# Patient Record
Sex: Male | Born: 1995 | Hispanic: No | Marital: Single | Smoking: Never smoker
Health system: Southern US, Community
[De-identification: ages and names within clinical notes are randomized; demographics above are authoritative.]

---
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
## 2014-04-05 ENCOUNTER — Emergency Department: Payer: Self-pay | Admitting: Emergency Medicine

## 2014-11-25 ENCOUNTER — Emergency Department: Payer: Self-pay | Admitting: Emergency Medicine

## 2015-12-11 ENCOUNTER — Encounter: Payer: Self-pay | Admitting: Emergency Medicine

## 2015-12-11 ENCOUNTER — Emergency Department
Admission: EM | Admit: 2015-12-11 | Discharge: 2015-12-11 | Disposition: A | Discharge status: Home / Self Care | Payer: Medicaid Other | Attending: Emergency Medicine | Admitting: Emergency Medicine

## 2015-12-11 DIAGNOSIS — Y929 Unspecified place or not applicable: Secondary | ICD-10-CM | POA: Diagnosis not present

## 2015-12-11 DIAGNOSIS — S76911A Strain of unspecified muscles, fascia and tendons at thigh level, right thigh, initial encounter: Secondary | ICD-10-CM | POA: Diagnosis not present

## 2015-12-11 DIAGNOSIS — Y9366 Activity, soccer: Secondary | ICD-10-CM | POA: Diagnosis not present

## 2015-12-11 DIAGNOSIS — Y990 Civilian activity done for income or pay: Secondary | ICD-10-CM | POA: Insufficient documentation

## 2015-12-11 DIAGNOSIS — X58XXXA Exposure to other specified factors, initial encounter: Secondary | ICD-10-CM | POA: Insufficient documentation

## 2015-12-11 DIAGNOSIS — M79651 Pain in right thigh: Secondary | ICD-10-CM | POA: Diagnosis present

## 2015-12-11 MED ORDER — NAPROXEN 500 MG PO TABS: 500.0000 mg | ORAL_TABLET | Freq: Two times a day (BID) | ORAL | Status: DC

## 2015-12-11 NOTE — ED Notes (Signed)
States he felt a pull in groin while playing soccer.  Pain is on the right side. Denies any n/v or urinary sxs'

## 2015-12-11 NOTE — ED Notes (Signed)
Pt states his right groin has been hurting, unknown injury to area, denies constant pain.

## 2015-12-11 NOTE — Discharge Instructions (Signed)
Cryotherapy Cryotherapy is when you put ice on your injury. Ice helps lessen pain and puffiness (swelling) after an injury. Ice works the best when you start using it in the first 24 to 48 hours after an injury. HOME CARE  Put a dry or damp towel between the ice pack and your skin.  You may press gently on the ice pack.  Leave the ice on for no more than 10 to 20 minutes at a time.  Check your skin after 5 minutes to make sure your skin is okay.  Rest at least 20 minutes between ice pack uses.  Stop using ice when your skin loses feeling (numbness).  Do not use ice on someone who cannot tell you when it hurts. This includes small children and people with memory problems (dementia). GET HELP RIGHT AWAY IF:  You have white spots on your skin.  Your skin turns blue or pale.  Your skin feels waxy or hard.  Your puffiness gets worse. MAKE SURE YOU:   Understand these instructions.  Will watch your condition.  Will get help right away if you are not doing well or get worse.   This information is not intended to replace advice given to you by your health care provider. Make sure you discuss any questions you have with your health care provider.   Document Released: 02/29/2008 Document Revised: 12/05/2011 Document Reviewed: 05/05/2011 Elsevier Interactive Patient Education 2016 Else[employer] Inc[employer]  Adult nurse and RICE WHAT DOES AN ELASTIC BANDAGE DO? Elastic bandages come in different shapes and sizes. They generally provide support to your injury and reduce swelling while you are healing, but they can perform different functions. Your health care provider will help you to decide what is best for your protection, recovery, or rehabilitation following an injury. WHAT ARE SOME GENERAL TIPS FOR USING AN ELASTIC BANDAGE?  Use the bandage as directed by the maker of the bandage that you are using.  Do not wrap the bandage too tightly. This may cut off the circulation in the arm or  leg in the area below the bandage.  If part of your body beyond the bandage becomes blue, numb, cold, swollen, or is more painful, your bandage is most likely too tight. If this occurs, remove your bandage and reapply it more loosely.  See your health care provider if the bandage seems to be making your problems worse rather than better.  An elastic bandage should be removed and reapplied every 3-4 hours or as directed by your health care provider. WHAT IS RICE? The routine care of many injuries includes rest, ice, compression, and elevation (RICE therapy).  Rest Rest is required to allow your body to heal. Generally, you can resume your routine activities when you are comfortable and have been given permission by your health care provider. Ice Icing your injury helps to keep the swelling down and it reduces pain. Do not apply ice directly to your skin.  Put ice in a plastic bag.  Place a towel between your skin and the bag.  Leave the ice on for 20 minutes, 2-3 times per day. Do this for as long as you are directed by your health care provider. Compression Compression helps to keep swelling down, gives support, and helps with discomfort. Compression may be done with an elastic bandage. Elevation Elevation helps to reduce swelling and it decreases pain. If possible, your injured area should be placed at or above the level of your heart or the center of your  chest. WHEN SHOULD I SEEK MEDICAL CARE? You should seek medical care if:  You have persistent pain and swelling.  Your symptoms are getting worse rather than improving. These symptoms may indicate that further evaluation or further X-rays are needed. Sometimes, X-rays may not show a small broken bone (fracture) until a number of days later. Make a follow-up appointment with your health care provider. Ask when your X-ray results will be ready. Make sure that you get your X-ray results. WHEN SHOULD I SEEK IMMEDIATE MEDICAL CARE? You  should seek immediate medical care if:  You have a sudden onset of severe pain at or below the area of your injury.  You develop redness or increased swelling around your injury.  You have tingling or numbness at or below the area of your injury that does not improve after you remove the elastic bandage.   This information is not intended to replace advice given to you by your health care provider. Make sure you discuss any questions you have with your health care provider.   Document Released: 03/04/2002 Document Revised: 06/03/2015 Document Reviewed: 04/28/2014 Elsevier Interactive Patient Education 2016 Elsevier Inc[employer]  Muscle Strain A muscle strain is an injury that occurs when a muscle is stretched beyond its normal length. Usually a small number of muscle fibers are torn when this happens. Muscle strain is rated in degrees. First-degree strains have the least amount of muscle fiber tearing and pain. Second-degree and third-degree strains have increasingly more tearing and pain.  Usually, recovery from muscle strain takes 1-2 weeks. Complete healing takes 5-6 weeks.  CAUSES  Muscle strain happens when a sudden, violent force placed on a muscle stretches it too far. This may occur with lifting, sports, or a fall.  RISK FACTORS Muscle strain is especially common in athletes.  SIGNS AND SYMPTOMS At the site of the muscle strain, there may be:  Pain.  Bruising.  Swelling.  Difficulty using the muscle due to pain or lack of normal function. DIAGNOSIS  Your health care provider will perform a physical exam and ask about your medical history. TREATMENT  Often, the best treatment for a muscle strain is resting, icing, and applying cold compresses to the injured area.  HOME CARE INSTRUCTIONS   Use the PRICE method of treatment to promote muscle healing during the first 2-3 days after your injury. The PRICE method involves:  Protecting the muscle from being injured  again.  Restricting your activity and resting the injured body part.  Icing your injury. To do this, put ice in a plastic bag. Place a towel between your skin and the bag. Then, apply the ice and leave it on from 15-20 minutes each hour. After the third day, switch to moist heat packs.  Apply compression to the injured area with a splint or elastic bandage. Be careful not to wrap it too tightly. This may interfere with blood circulation or increase swelling.  Elevate the injured body part above the level of your heart as often as you can.  Only take over-the-counter or prescription medicines for pain, discomfort, or fever as directed by your health care provider.  Warming up prior to exercise helps to prevent future muscle strains. SEEK MEDICAL CARE IF:   You have increasing pain or swelling in the injured area.  You have numbness, tingling, or a significant loss of strength in the injured area. MAKE SURE YOU:   Understand these instructions.  Will watch your condition.  Will get help right away if  you are not doing well or get worse.   This information is not intended to replace advice given to you by your health care provider. Make sure you discuss any questions you have with your health care provider.   Document Released: 09/12/2005 Document Revised: 07/03/2013 Document Reviewed: 04/11/2013 Elsevier Interactive Patient Education [employer]

## 2015-12-11 NOTE — ED Provider Notes (Signed)
[facility] Emergency Department Provider Note  ____________________________________________  Time seen: Approximately 9:18 AM  I have reviewed the triage vital signs and the nursing notes.   HISTORY  Chief Complaint Groin Pain    HPI Albert Stark is a 20 y.o. male , NAD, presents to the emergency department with one-day history of right upper inner thigh pain. States the pain started while he was playing soccer. Felt like he pulled a muscle in the area. Has wrapped the area in Ace wrap, apply an ice and has been resting over the last 24 hours which seemed to help. States he was called into work today but unfortunately when he walks or lists leg he has pain in the area. Denies any abdominal pain, nausea, vomiting. Has had no testicular pain or swelling. Denies any urinary or bowel changes. No urethral discharge. Denies any redness, warmth, swelling to the area. No open wounds or skin sores.   History reviewed. No pertinent past medical history.  There are no active problems to display for this patient.   History reviewed. No pertinent past surgical history.  Current Outpatient Rx  Name  Route  Sig  Dispense  Refill  . naproxen (NAPROSYN) 500 MG tablet   Oral   Take 1 tablet (500 mg total) by mouth 2 (two) times daily with a meal.   14 tablet   0     Allergies Review of patient's allergies indicates no known allergies.  No family history on file.  Social History Social History  Substance Use Topics  . Smoking status: Never Smoker   . Smokeless tobacco: None  . Alcohol Use: None     Review of Systems  Constitutional: No fever/chills, fatigue Cardiovascular: No chest pain. Respiratory:  No shortness of breath.  Gastrointestinal: No abdominal pain.  No nausea, vomiting.   Genitourinary: Negative for dysuria, hematuria, urethral discharge. No urinary hesitancy, urgency or increased frequency. No testicular pain, swelling. Musculoskeletal:  Positive right inner thigh pain. Negative for back pain.  Skin: Negative for rash, redness, warmth, swelling. Neurological: Negative for headaches, focal weakness or numbness. 10-point ROS otherwise negative.  ____________________________________________   PHYSICAL EXAM:  VITAL SIGNS: ED Triage Vitals  Enc Vitals Group     BP 12/11/15 0844 113/67 mmHg     Pulse Rate 12/11/15 0844 57     Resp 12/11/15 0844 18     Temp 12/11/15 0844 98 F (36.7 C)     Temp Source 12/11/15 0844 Oral     SpO2 12/11/15 0844 99 %     Weight 12/11/15 0844 130 lb (58.968 kg)     Height 12/11/15 0844 5\' 2"  (1.575 m)     Head Cir --      Peak Flow --      Pain Score 12/11/15 0845 4     Pain Loc --      Pain Edu? --      Excl. in GC? --    Physical exam completed in the presence of Chapman MossFelicia Moore, ED Tech.   Constitutional: Alert and oriented. Well appearing and in no acute distress. Eyes: Conjunctivae are normal.  Head: Atraumatic. Hematological/Lymphatic/Immunilogical: No inguinal lymphadenopathy. Cardiovascular:  Good peripheral circulation with bilateral lower extremities having 2+ pulses. Respiratory: Normal respiratory effort without tachypnea or retractions.  Gastrointestinal: Soft and nontender in all quadrants. No distention, guarding.  Musculoskeletal: No tenderness to palpation about the right inguinal area nor right thigh. Pain elicited about the upper inner right thigh with raising  of the right lower extremity against resistance and without resistance. No soft tissue deformities noted to palpation of the right thigh. No lower extremity edema.  No joint effusions. Neurologic:  Normal speech and language. No gross focal neurologic deficits are appreciated.  Skin:  Skin is warm, dry and intact. No rash, erythema, swelling noted. Psychiatric: Mood and affect are normal. Speech and behavior are normal. Patient exhibits appropriate insight and  judgement.   ____________________________________________   LABS  None  ____________________________________________  EKG  None ____________________________________________  RADIOLOGY  None ____________________________________________    PROCEDURES  Procedure(s) performed: None    Medications - No data to display   ____________________________________________   INITIAL IMPRESSION / ASSESSMENT AND PLAN / ED COURSE  Patient's diagnosis is consistent with right thigh muscle strain. Patient will be discharged home with prescriptions for naproxen to take as prescribed. Patient is to continue to ice the area 20 minutes 3-4 times daily as needed. May continue use of Ace wrap. Patient advised no sports or gym participation over the next 7 days to allow the restrained help. Also given a work note for 2 days. Patient is to follow up with [facility] community clinic if symptoms persist past this treatment course. Patient is given ED precautions to return to the ED for any worsening or new symptoms.    ____________________________________________  FINAL CLINICAL IMPRESSION(S) / ED DIAGNOSES  Final diagnoses:  Muscle strain of thigh, right, initial encounter      NEW MEDICATIONS STARTED DURING THIS VISIT:  New Prescriptions   NAPROXEN (NAPROSYN) 500 MG TABLET    Take 1 tablet (500 mg total) by mouth 2 (two) times daily with a meal.         Hope Pigeon, PA-C 12/11/15 1610  Emily Filbert, MD 12/11/15 1329

## 2016-07-21 ENCOUNTER — Emergency Department
Admission: EM | Admit: 2016-07-21 | Discharge: 2016-07-21 | Disposition: A | Discharge status: Home / Self Care | Payer: Self-pay | Attending: Emergency Medicine | Admitting: Emergency Medicine

## 2016-07-21 ENCOUNTER — Emergency Department: Payer: Self-pay

## 2016-07-21 ENCOUNTER — Encounter: Payer: Self-pay | Admitting: *Deleted

## 2016-07-21 DIAGNOSIS — Y9366 Activity, soccer: Secondary | ICD-10-CM | POA: Insufficient documentation

## 2016-07-21 DIAGNOSIS — Y998 Other external cause status: Secondary | ICD-10-CM | POA: Insufficient documentation

## 2016-07-21 DIAGNOSIS — W2102XA Struck by soccer ball, initial encounter: Secondary | ICD-10-CM | POA: Insufficient documentation

## 2016-07-21 DIAGNOSIS — S92411A Displaced fracture of proximal phalanx of right great toe, initial encounter for closed fracture: Secondary | ICD-10-CM | POA: Insufficient documentation

## 2016-07-21 DIAGNOSIS — Y929 Unspecified place or not applicable: Secondary | ICD-10-CM | POA: Insufficient documentation

## 2016-07-21 MED ORDER — TRAMADOL HCL 50 MG PO TABS: 50.0000 mg | ORAL_TABLET | Freq: Four times a day (QID) | ORAL | 0 refills | Status: DC | PRN

## 2016-07-21 MED ORDER — NAPROXEN 500 MG PO TABS: 500.0000 mg | ORAL_TABLET | Freq: Two times a day (BID) | ORAL | 0 refills | Status: DC

## 2016-07-21 NOTE — ED Provider Notes (Signed)
[facility]-EMERGENCY DEPARTMENT Provider Note   CSN: [id number] Arrival date & time: 07/21/16  1739     History   Chief Complaint Chief Complaint  Patient presents with  . Toe Pain    HPI Albert Stark is a 20 y.o. male presents to the ED with pain in the right big toe for 4 days.  States he was playing soccer and went to kick the ball when someone else came and kicked his right foot and toe.  He has not been active on his toe the past four days but has been walking around at work.  Admits to numbness and decreased range of motion in his right big toe. Pain is moderate increased with weightbearing.  HPI  No past medical history on file.  There are no active problems to display for this patient.   No past surgical history on file.     Home Medications    Prior to Admission medications   Medication Sig Start Date End Date Taking? Authorizing Provider  naproxen (NAPROSYN) 500 MG tablet Take 1 tablet (500 mg total) by mouth 2 (two) times daily with a meal. 07/21/16   Evon Slack, PA-C  traMADol (ULTRAM) 50 MG tablet Take 1 tablet (50 mg total) by mouth every 6 (six) hours as needed. 07/21/16   Evon Slack, PA-C    Family History No family history on file.  Social History Social History  Substance Use Topics  . Smoking status: Never Smoker  . Smokeless tobacco: Never Used  . Alcohol use No     Allergies   Review of patient's allergies indicates no known allergies.   Review of Systems Review of Systems  Constitutional: Negative for chills and fever.  HENT: Negative for ear pain and sore throat.   Eyes: Negative for pain and visual disturbance.  Respiratory: Negative for cough and shortness of breath.   Cardiovascular: Negative for chest pain and palpitations.  Gastrointestinal: Negative for abdominal pain and vomiting.  Genitourinary: Negative for dysuria and hematuria.  Musculoskeletal: Positive for arthralgias (right big toe) and joint swelling (right  big toe). Negative for back pain.  Skin: Negative for color change and rash.  Neurological: Negative for seizures and syncope.  All other systems reviewed and are negative.    Physical Exam Updated Vital Signs BP (!) 124/52 (BP Location: Right Arm)   Pulse (!) 52   Temp 99 F (37.2 C) (Oral)   Resp 18   Ht 5\' 3"  (1.6 m)   Wt 56.7 kg   SpO2 100%   BMI 22.14 kg/m   Physical Exam  Constitutional: He appears well-developed and well-nourished.  HENT:  Head: Normocephalic and atraumatic.  Eyes: Conjunctivae are normal.  Neck: Neck supple.  Cardiovascular: Normal rate and regular rhythm.   No murmur heard. Pulmonary/Chest: Effort normal and breath sounds normal. No respiratory distress.  Abdominal: Soft. There is no tenderness.  Musculoskeletal: He exhibits edema (in right big toe) and tenderness (in right big toe).  AROM normal in right ankle flexion, extension, inversion and eversion. AROM limited in right big toe and other toes.  Tender to palpation along the MTP and the distal joint. Swelling noted on exam in right big toe.   Neurological: He is alert.  Skin: Skin is warm and dry.  Psychiatric: He has a normal mood and affect.  Nursing note and vitals reviewed.    ED Treatments / Results  Labs (all labs ordered are listed, but only abnormal results are displayed) Labs  Reviewed - No data to display  EKG  EKG Interpretation None       Radiology Dg Toe Great Right  Result Date: 07/21/2016 CLINICAL DATA:  Right great toe injury playing soccer last week. Great toe pain and swelling. Initial encounter. EXAM: RIGHT GREAT TOE -THREE VIEW COMPARISON:  None. FINDINGS: A comminuted fracture of the proximal phalanx is seen with intra-articular extension into the interphalangeal joint. No other fractures are seen. No evidence of dislocation. Associated soft tissue swelling is noted. IMPRESSION: Comminuted fracture of proximal phalanx of great toe. Electronically Signed   By:  Myles RosenthalJohn  Stahl M.D.   On: 07/21/2016 19:00    Procedures Procedures (including critical care time)  Medications Ordered in ED Medications - No data to display   Initial Impression / Assessment and Plan / ED Course  I have reviewed the triage vital signs and the nursing notes.  Pertinent labs & imaging results that were available during my care of the patient were reviewed by me and considered in my medical decision making (see chart for details).  Clinical Course    Albert ClayDaniel Chavous is a 20 year old male presenting to the emergency room with an injury to his right big toe secondary to playing soccer and getting kicked in the right foot. The x ray shows an indication of comminuted fracture of the proximal phalanx in the right big toe.   Naproxen 500mg  PO BID PRN for pain control.  Tramadol 50 mg PO every six hours PRN for severe pain in right big toe.  Crutches and post op shoe given to patient to help decrease movement of right great toe.  Follow-up with podiatrist as soon as the next appointment for further evaluation.    Final Clinical Impressions(s) / ED Diagnoses   Final diagnoses:  Displaced fracture of proximal phalanx of right great toe, initial encounter for closed fracture    New Prescriptions New Prescriptions   NAPROXEN (NAPROSYN) 500 MG TABLET    Take 1 tablet (500 mg total) by mouth 2 (two) times daily with a meal.   TRAMADOL (ULTRAM) 50 MG TABLET    Take 1 tablet (50 mg total) by mouth every 6 (six) hours as needed.     Evon Slackhomas C Stark Solum, PA-C 07/21/16 2026    Jennye MoccasinBrian S Quigley, MD 07/21/16 2033

## 2016-07-21 NOTE — ED Notes (Signed)
Pt has pain in right great toe.  Injured last week playing soccer.  Mild swelling noted to toe.

## 2016-07-21 NOTE — Discharge Instructions (Signed)
Please avoid weightbearing on the right lower extremity. Take naproxen as needed for mild to moderate pain. Use tramadol for severe pain. Follow-up with foot doctor. Return to the ER for any worsening symptoms or urgent changes in her health.

## 2016-07-21 NOTE — ED Triage Notes (Signed)
Pt has right great toe pain.  Injured playing soccer last week.  Painful to ambulate.

## 2017-01-27 ENCOUNTER — Emergency Department: Payer: Commercial Managed Care - HMO

## 2017-01-27 ENCOUNTER — Emergency Department
Admission: EM | Admit: 2017-01-27 | Discharge: 2017-01-27 | Disposition: A | Discharge status: Home / Self Care | Payer: Commercial Managed Care - HMO | Attending: Emergency Medicine | Admitting: Emergency Medicine

## 2017-01-27 ENCOUNTER — Encounter: Payer: Self-pay | Admitting: Emergency Medicine

## 2017-01-27 DIAGNOSIS — L600 Ingrowing nail: Secondary | ICD-10-CM | POA: Insufficient documentation

## 2017-01-27 DIAGNOSIS — M79674 Pain in right toe(s): Secondary | ICD-10-CM | POA: Diagnosis present

## 2017-01-27 MED ORDER — KETOROLAC TROMETHAMINE 60 MG/2ML IM SOLN
30.0000 mg | Freq: Once | INTRAMUSCULAR | Status: AC
  Administered 2017-01-27: 30 mg via INTRAMUSCULAR
  Filled 2017-01-27: qty 2

## 2017-01-27 MED ORDER — CEPHALEXIN 500 MG PO CAPS: 500.0000 mg | ORAL_CAPSULE | Freq: Four times a day (QID) | ORAL | 0 refills | Status: AC

## 2017-01-27 MED ORDER — NAPROXEN 500 MG PO TABS: 500.0000 mg | ORAL_TABLET | Freq: Two times a day (BID) | ORAL | 1 refills | Status: AC

## 2017-01-27 NOTE — Discharge Instructions (Signed)
Take medication as prescribed.Return to emergency department if symptoms worsen and follow-up with PCP as needed.    Follow up with Podiatry referral.   Foot soak with Epsom Salt: 2-3 times/day. M?x h?lf a ?u? ?f E???m S?lt w?th 2 quarts ?f v?r? w?rm w?t?r until d????lv?d. S??k feet for 10 minutes ?r as long ?? d???r?d. Rinse w?th t?? w?t?r and dr?.

## 2017-01-27 NOTE — ED Triage Notes (Signed)
Pt reports right great toe pain for one week. Pt states pain today is worse. Pt reports no pain at rest but when toe is touched pain is 10/10.

## 2017-01-27 NOTE — ED Provider Notes (Signed)
[facility] Emergency Department Provider Note   ____________________________________________   I have reviewed the triage vital signs and the nursing notes.   HISTORY  Chief Complaint Toe Pain    HPI Albert Stark is a 21 y.o. male reports 1 week history of toe pain. Pain started after trimming his toe nail. Patient notes breaking the right great toe during a soccer match last fall. He stated that there was delayed healing of the fractured area. Patient denies pain at rest but exquisite tenderness with palpation of the right great toe. Patient denies pain in any of the other toes of the right foot. Patient denies fever, chills, headache, shortness of breath.   History reviewed. No pertinent past medical history.  There are no active problems to display for this patient.   No past surgical history on file.  Prior to Admission medications   Medication Sig Start Date End Date Taking? Authorizing Provider  cephALEXin (KEFLEX) 500 MG capsule Take 1 capsule (500 mg total) by mouth 4 (four) times daily. 01/27/17 02/06/17  Yitzchok Carriger M Isobelle Tuckett, PA-C  naproxen (NAPROSYN) 500 MG tablet Take 1 tablet (500 mg total) by mouth 2 (two) times daily with a meal. 1-2 tablets with meals as needed for pain 01/27/17 01/27/18  Chloe Miyoshi M Jarry Manon, PA-C  traMADol (ULTRAM) 50 MG tablet Take 1 tablet (50 mg total) by mouth every 6 (six) hours as needed. 07/21/16   Evon Slack, PA-C    Allergies Patient has no known allergies.  No family history on file.  Social History Social History  Substance Use Topics  . Smoking status: Never Smoker  . Smokeless tobacco: Never Used  . Alcohol use No    Review of Systems Constitutional: No fever/chills Eyes: No visual changes. ENT: No sore throat. Cardiovascular: Denies chest pain. Respiratory: Denies cough Gastrointestinal: No abdominal pain.  No nausea, no vomiting.   Genitourinary: Negative for dysuria. Musculoskeletal: Right great  toe pain Skin: Negative for rash. Neurological: Negative for headaches  ____________________________________________   PHYSICAL EXAM:  VITAL SIGNS: ED Triage Vitals  Enc Vitals Group     BP 01/27/17 1442 132/70     Pulse Rate 01/27/17 1442 80     Resp 01/27/17 1442 16     Temp 01/27/17 1442 99.1 F (37.3 C)     Temp Source 01/27/17 1442 Oral     SpO2 01/27/17 1442 99 %     Weight 01/27/17 1442 130 lb (59 kg)     Height 01/27/17 1442 5\' 3"  (1.6 m)     Head Circumference --      Peak Flow --      Pain Score 01/27/17 1445 0     Pain Loc --      Pain Edu? --      Excl. in GC? --     Constitutional: Alert and oriented. Well appearing and in no acute distress. Eyes: Conjunctivae are normal. Head: Atraumatic. Nose: No congestion/rhinnorhea. Mouth/Throat: Mucous membranes are moist.   Cardiovascular: Normal rate, regular rhythm.  Good peripheral circulation. Respiratory: Normal respiratory effort.  No retractions.  Genitourinary: deferred Musculoskeletal: No lower extremity tenderness nor edema.  No joint effusions. Neurologic:  Normal speech and language. No gross focal neurologic deficits are appreciated.  Skin:  Skin is warm, dry and intact. No rash noted. Psychiatric: Mood and affect are normal. Speech and behavior are normal.  ____________________________________________   LABS (all labs ordered are listed, but only abnormal results are displayed)  Labs  Reviewed - No data to display ____________________________________________  EKG none  ____________________________________________  RADIOLOGY Right Great Toe IMPRESSION: 1. No acute osseous finding. 2. First proximal phalanx fracture seen previously has healed. ____________________________________________   PROCEDURES  Procedure(s) performed: no     Critical Care performed: no ____________________________________________   INITIAL IMPRESSION / ASSESSMENT AND PLAN / ED COURSE  Pertinent labs &  imaging results that were available during my care of the patient were reviewed by me and considered in my medical decision making (see chart for details).  Patient symptoms consistent with ingrown toenail. Appearance of nail and angle the nail was cut by patient appears appropriate for referral to podiatry. Antibiotic regimen will be initiated and patient encouraged to manage pain with NSAIDS.   Follow up with Podiatry referral.   Recommend soaking toe until follow-up:  Foot soak with Epsom Salt: 2-3 times/day. M?x h?lf a ?u? ?f E???m S?lt w?th 2 quarts ?f v?r? w?rm w?t?r until d????lv?d. S??k feet for 10 minutes ?r as long ?? d???r?d. Rinse w?th t?? w?t?r and dr?.      ____________________________________________   FINAL CLINICAL IMPRESSION(S) / ED DIAGNOSES  Final diagnoses:  Ingrown toenail of right foot with infection      NEW MEDICATIONS STARTED DURING THIS VISIT:  Discharge Medication List as of 01/27/2017  4:08 PM    START taking these medications   Details  cephALEXin (KEFLEX) 500 MG capsule Take 1 capsule (500 mg total) by mouth 4 (four) times daily., Starting Fri 01/27/2017, Until Mon 02/06/2017, Print         Note:  This document was prepared using Dragon voice recognition software and may include unintentional dictation errors.   Jordan Likesraci M Erice Ahles, PA-C 01/27/17 [id number]    Sharyn CreamerMark Quale, MD 01/28/17 [phone]

## 2017-06-26 ENCOUNTER — Ambulatory Visit
Admission: EM | Admit: 2017-06-26 | Discharge: 2017-06-26 | Disposition: A | Discharge status: Home / Self Care | Payer: Commercial Managed Care - HMO | Attending: Family Medicine | Admitting: Family Medicine

## 2017-06-26 ENCOUNTER — Encounter: Payer: Self-pay | Admitting: *Deleted

## 2017-06-26 DIAGNOSIS — S83402A Sprain of unspecified collateral ligament of left knee, initial encounter: Secondary | ICD-10-CM | POA: Diagnosis not present

## 2017-06-26 DIAGNOSIS — M25562 Pain in left knee: Secondary | ICD-10-CM

## 2017-06-26 MED ORDER — HYDROCODONE-ACETAMINOPHEN 5-325 MG PO TABS: ORAL_TABLET | ORAL | 0 refills | Status: DC

## 2017-06-26 NOTE — ED Provider Notes (Signed)
[facility]    CSN: [id number] Arrival date & time: 06/26/17  0913     History   Chief Complaint Chief Complaint  Patient presents with  . Knee Pain    HPI Albert Stark is a 21 y.o. male.   20 yo male with a c/o left inner knee pain since injuring it while playing soccer 3 days ago. States he went to kick the ball and collide with another players leg. Patient states his leg went back. Complains of pain and swelling to medial aspect of knee.    The history is provided by the patient.  Knee Pain    History reviewed. No pertinent past medical history.  There are no active problems to display for this patient.   History reviewed. No pertinent surgical history.     Home Medications    Prior to Admission medications   Medication Sig Start Date End Date Taking? Authorizing Provider  HYDROcodone-acetaminophen (NORCO/VICODIN) 5-325 MG tablet 1-2 tabs po qd prn severe pain 06/26/17   Payton Mccallum, MD  naproxen (NAPROSYN) 500 MG tablet Take 1 tablet (500 mg total) by mouth 2 (two) times daily with a meal. 1-2 tablets with meals as needed for pain 01/27/17 01/27/18  Little, Traci M, PA-C  traMADol (ULTRAM) 50 MG tablet Take 1 tablet (50 mg total) by mouth every 6 (six) hours as needed. 07/21/16   Evon Slack, PA-C    Family History History reviewed. No pertinent family history.  Social History Social History  Substance Use Topics  . Smoking status: Never Smoker  . Smokeless tobacco: Never Used  . Alcohol use No     Allergies   Patient has no known allergies.   Review of Systems Review of Systems   Physical Exam Triage Vital Signs ED Triage Vitals  Enc Vitals Group     BP 06/26/17 1000 111/64     Pulse Rate 06/26/17 1000 (!) 52     Resp 06/26/17 1000 16     Temp 06/26/17 1000 98.2 F (36.8 C)     Temp Source 06/26/17 1000 Oral     SpO2 06/26/17 1000 100 %     Weight 06/26/17 1002 130 lb (59 kg)     Height 06/26/17 1002  (1.6 m)   Head Circumference --      Peak Flow --      Pain Score 06/26/17 1044 3     Pain Loc --      Pain Edu? --      Excl. in GC? --    No data found.   Updated Vital Signs BP 111/64 (BP Location: Left Arm)   Pulse (!) 52   Temp 98.2 F (36.8 C) (Oral)   Resp 16   Ht  (1.6 m)   Wt 130 lb (59 kg)   SpO2 100%   BMI 23.03 kg/m   Visual Acuity Right Eye Distance:   Left Eye Distance:   Bilateral Distance:    Right Eye Near:   Left Eye Near:    Bilateral Near:     Physical Exam  Constitutional: He appears well-developed and well-nourished. No distress.  Musculoskeletal:       Left knee: He exhibits swelling. He exhibits normal range of motion, no effusion, no ecchymosis, no deformity, no laceration, no erythema, normal alignment, no LCL laxity, normal patellar mobility, no bony tenderness, normal meniscus and no MCL laxity. Tenderness found. MCL tenderness noted. No medial joint line, no lateral joint  line, no LCL and no patellar tendon tenderness noted.  Skin: He is not diaphoretic.  Nursing note and vitals reviewed.    UC Treatments / Results  Labs (all labs ordered are listed, but only abnormal results are displayed) Labs Reviewed - No data to display  EKG  EKG Interpretation None       Radiology No results found.  Procedures Procedures (including critical care time)  Medications Ordered in UC Medications - No data to display   Initial Impression / Assessment and Plan / UC Course  I have reviewed the triage vital signs and the nursing notes.  Pertinent labs & imaging results that were available during my care of the patient were reviewed by me and considered in my medical decision making (see chart for details).       Final Clinical Impressions(s) / UC Diagnoses   Final diagnoses:  Sprain of collateral ligament of left knee, initial encounter    New Prescriptions Discharge Medication List as of 06/26/2017 10:40 AM    START taking these  medications   Details  HYDROcodone-acetaminophen (NORCO/VICODIN) 5-325 MG tablet 1-2 tabs po qd prn severe pain, Print       1. diagnosis reviewed with patient 2. rx as per orders above; reviewed possible side effects, interactions, risks and benefits  3. Recommend supportive treatment with rest, ice, elevation, otc analgesics prn 4. Knee brace/sleeve for support 5. Follow-up prn if symptoms worsen or don't improve   Controlled Substance Prescriptions [place] Controlled Substance Registry consulted? no   Payton Mccallum, MD 06/26/17 1118

## 2017-06-26 NOTE — ED Triage Notes (Signed)
Twisted left knee while playing soccer Saturday. Now c/o left medial knee pain.

## 2017-10-21 IMAGING — DX DG TOE GREAT 2+V*R*
2 series · 2 of 2 positions shown · non-contrast
Comparison: None.

CLINICAL DATA: Right great toe injury playing soccer last week.
Great toe pain and swelling. Initial encounter.

EXAM:
RIGHT GREAT TOE -THREE VIEW

[toe ap]
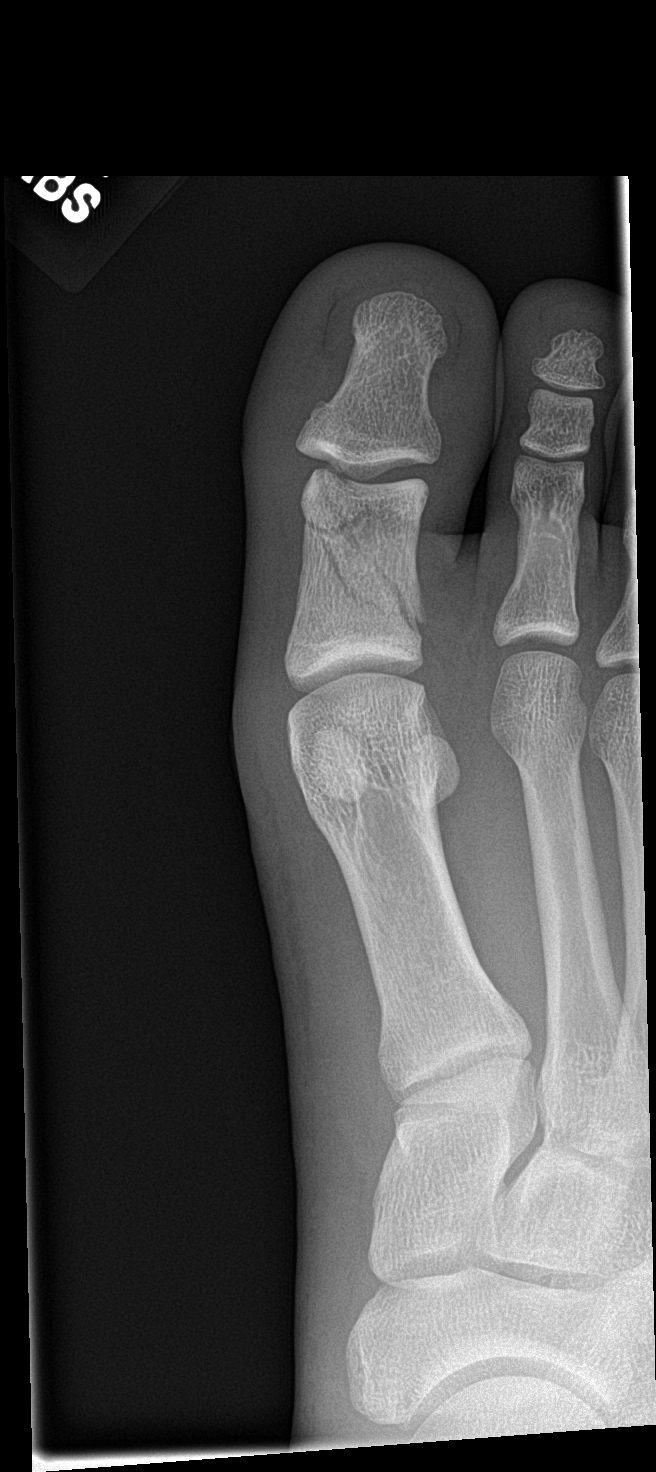

[toe obl]
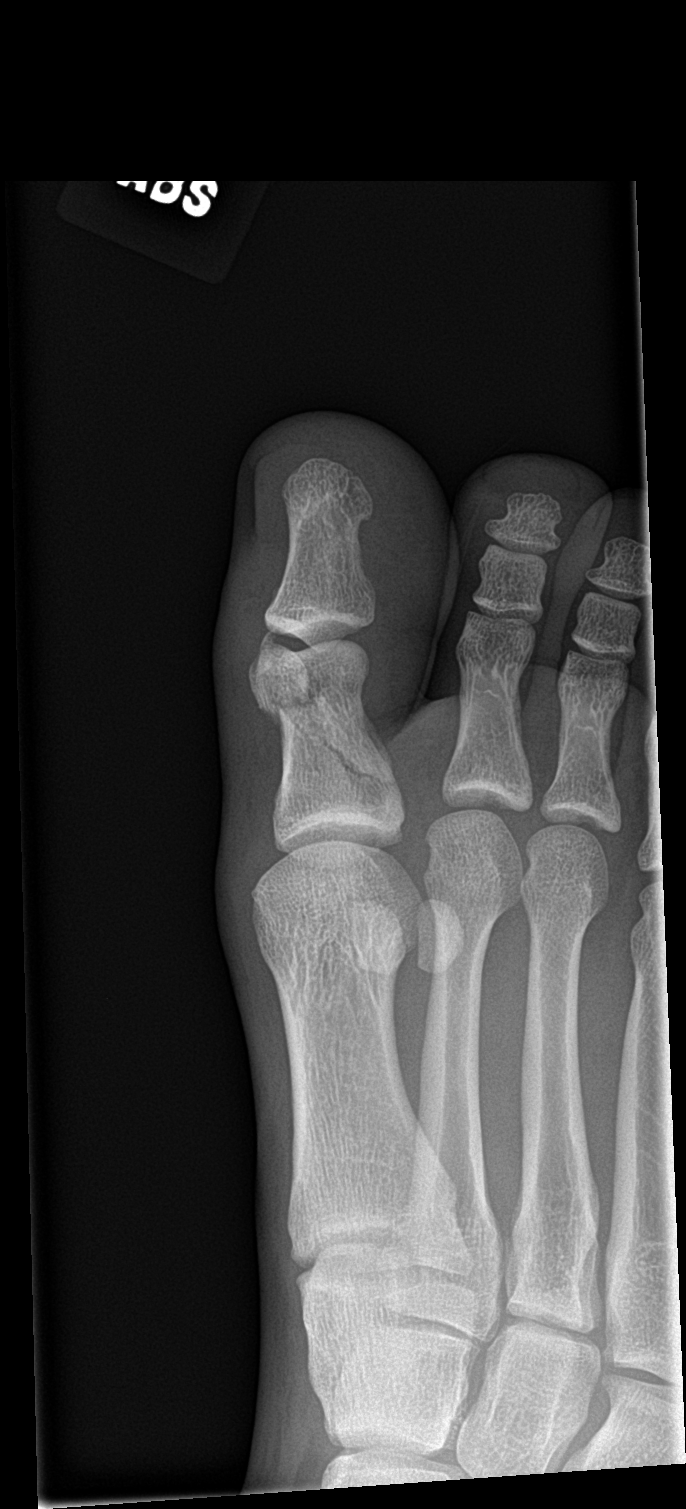

[2 of 2 positions shown; findings below may reference images not displayed]

FINDINGS: A comminuted fracture of the proximal phalanx is seen with
intra-articular extension into the interphalangeal joint.

No other fractures are seen. No evidence of dislocation. Associated
soft tissue swelling is noted.
IMPRESSION: Comminuted fracture of proximal phalanx of great toe.

## 2018-04-29 IMAGING — DX DG TOE GREAT 2+V*R*
3 series · 3 of 3 positions shown · non-contrast
Comparison: 07/21/2016

CLINICAL DATA: Ingrown toenail on the right.

EXAM:
RIGHT GREAT TOE

[toe ap]
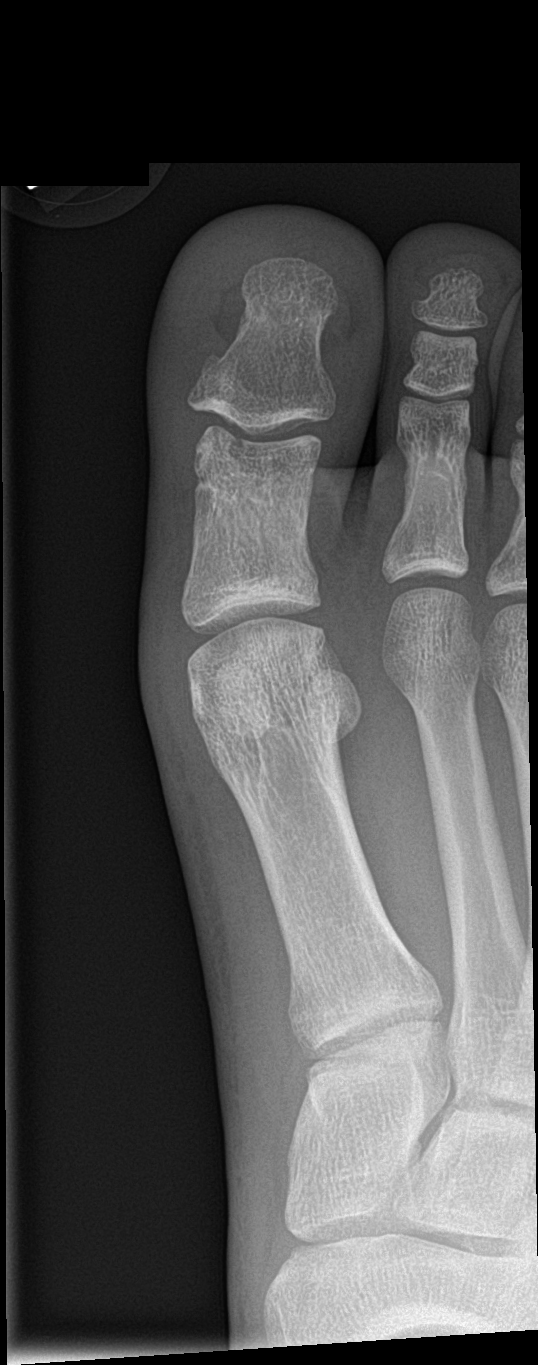

[toe obl]
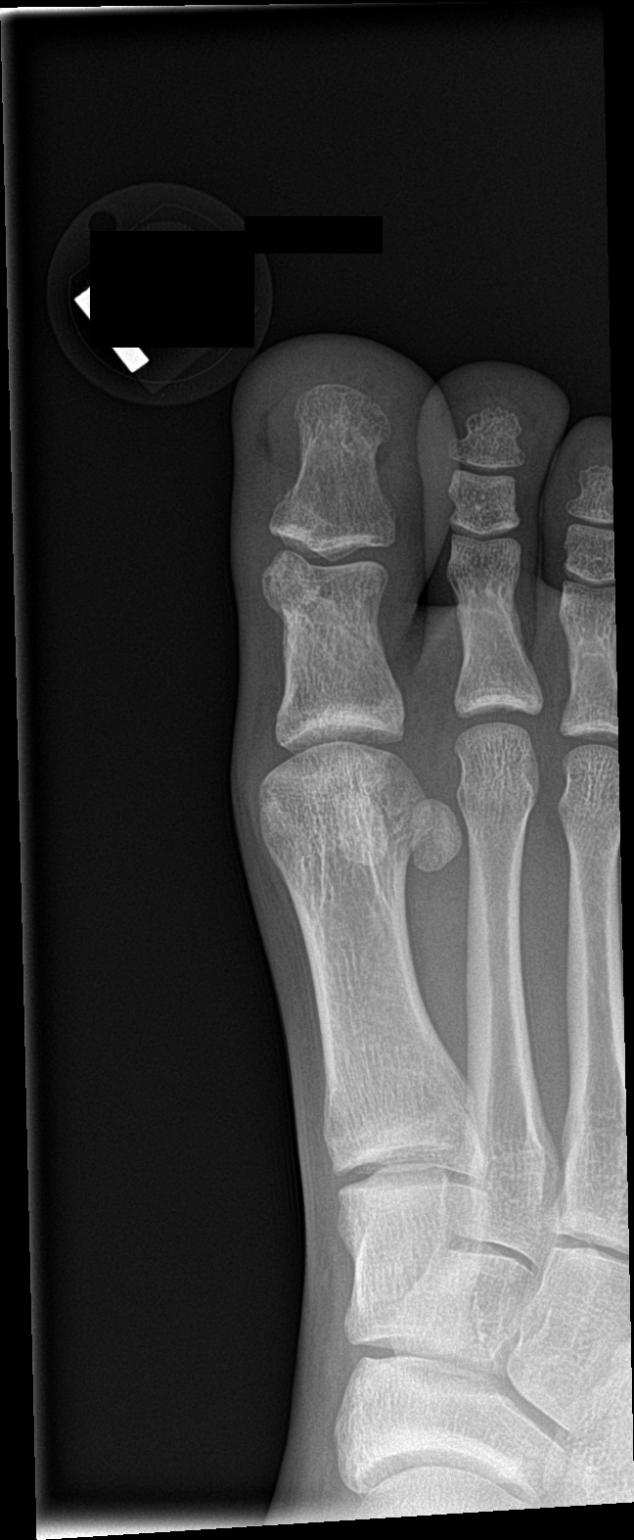

[toe lat]
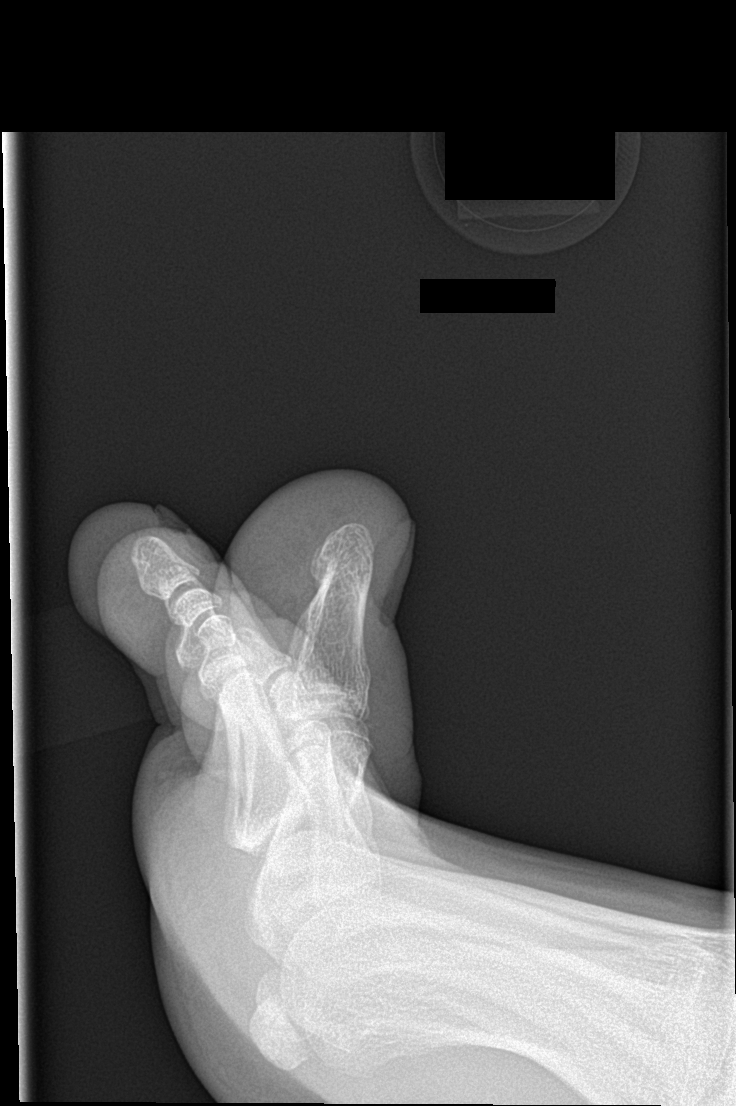

[3 of 3 positions shown; findings below may reference images not displayed]

FINDINGS: No evidence of osteomyelitis.  No opaque foreign body.

First proximal phalanx fracture has healed.  No joint narrowing.
IMPRESSION: 1. No acute osseous finding.
2. First proximal phalanx fracture seen previously has healed.

## 2019-06-11 ENCOUNTER — Other Ambulatory Visit: Payer: Self-pay

## 2019-06-11 ENCOUNTER — Encounter: Payer: Self-pay | Admitting: Emergency Medicine

## 2019-06-11 ENCOUNTER — Ambulatory Visit
Admission: EM | Admit: 2019-06-11 | Discharge: 2019-06-11 | Disposition: A | Discharge status: Home / Self Care | Payer: 59 | Attending: Family Medicine | Admitting: Family Medicine

## 2019-06-11 DIAGNOSIS — R05 Cough: Secondary | ICD-10-CM | POA: Diagnosis not present

## 2019-06-11 DIAGNOSIS — J069 Acute upper respiratory infection, unspecified: Secondary | ICD-10-CM | POA: Diagnosis not present

## 2019-06-11 DIAGNOSIS — Z7189 Other specified counseling: Secondary | ICD-10-CM | POA: Diagnosis not present

## 2019-06-11 DIAGNOSIS — J029 Acute pharyngitis, unspecified: Secondary | ICD-10-CM | POA: Diagnosis not present

## 2019-06-11 DIAGNOSIS — R110 Nausea: Secondary | ICD-10-CM

## 2019-06-11 LAB — RAPID STREP SCREEN (MED CTR MEBANE ONLY): Streptococcus, Group A Screen (Direct): NEGATIVE

## 2019-06-11 NOTE — ED Provider Notes (Signed)
[place] [place]   Name: Chandra Tromp DOB: 01-25-1996 MRN: [id number] CSN: [id number] PCP: Patient, No Pcp Per  Arrival date and time:  06/11/19 1602  Chief Complaint:  Sore Throat and Headache   NOTE: Prior to seeing the patient today, I have reviewed the triage nursing documentation and vital signs. Clinical staff has updated patient's PMH/PSHx, current medication list, and drug allergies/intolerances to ensure comprehensive history available to assist in medical decision making.   History:   HPI: Keeyon Kudrick is a 23 y.o. male who presents today with complaints of sore throat and headache that began earlier today. He has had some mild episodes of nausea; has not vomited. He is eating and drinking well. Patient denies fevers at home, but he has had a few episodes of chills. Patient presents today with a documented temperature of 100.5. Patient has not experienced any cough or shortness of breath. He denies any alternations to his sense of taste or smell. Patient denies close contact with anyone known to be ill. He has never been tested for SARS-CoV-2 (novel coronavirus). Patient presents for testing out of concern for his personal health. He adds that he is is being required to provide documentation of negative test results before he will be allowed to return to work.   History reviewed. No pertinent past medical history.  History reviewed. No pertinent surgical history.  History reviewed. No pertinent family history.  Social History   Tobacco Use  . Smoking status: Never Smoker  . Smokeless tobacco: Never Used  Substance Use Topics  . Alcohol use: Yes  . Drug use: No    There are no active problems to display for this patient.   Home Medications:    No outpatient medications have been marked as taking for the 06/11/19 encounter [facility] Encounter).    Allergies:   Patient has no known allergies.  Review of Systems (ROS): Review of Systems  Constitutional: Positive  for chills. Negative for fatigue and fever.  HENT: Positive for sore throat. Negative for congestion, ear pain, postnasal drip, rhinorrhea, sinus pressure, sinus pain, sneezing and trouble swallowing.   Eyes: Negative for pain, discharge and redness.  Respiratory: Negative for cough, chest tightness and shortness of breath.   Cardiovascular: Negative for chest pain and palpitations.  Gastrointestinal: Positive for nausea. Negative for abdominal pain, diarrhea and vomiting.  Musculoskeletal: Negative for arthralgias, back pain, myalgias and neck pain.  Skin: Negative for color change, pallor and rash.  Neurological: Positive for headaches. Negative for dizziness, syncope and weakness.  Hematological: Negative for adenopathy.     Vital Signs: Today's Vitals   06/11/19 1615 06/11/19 1617 06/11/19 1645  BP:  117/63   Pulse:  71   Resp:  18   Temp:  (!) 100.5 F (38.1 C)   TempSrc:  Oral   SpO2:  100%   Weight: 145 lb (65.8 kg)    Height: 5\' 3"  (1.6 m)    PainSc: 3   3     Physical Exam: Physical Exam  Constitutional: He is oriented to person, place, and time and well-developed, well-nourished, and in no distress. No distress.  HENT:  Head: Normocephalic and atraumatic.  Right Ear: Hearing, tympanic membrane, external ear and ear canal normal.  Left Ear: Hearing, tympanic membrane, external ear and ear canal normal.  Nose: Nose normal. No rhinorrhea or sinus tenderness.  Mouth/Throat: Uvula is midline and mucous membranes are normal. Posterior oropharyngeal erythema (mild; (+) clear PND) present. No posterior oropharyngeal edema.  Eyes: Pupils are equal, round, and reactive to light. Conjunctivae and EOM are normal.  Neck: Normal range of motion. Neck supple. No tracheal deviation present.  Cardiovascular: Normal rate, regular rhythm, normal heart sounds and intact distal pulses. Exam reveals no gallop and no friction rub.  No murmur heard. Pulmonary/Chest: Effort normal and breath  sounds normal. No respiratory distress. He has no wheezes. He has no rales.  Abdominal: Soft. Bowel sounds are normal. He exhibits no distension. There is no abdominal tenderness.  Musculoskeletal: Normal range of motion.  Lymphadenopathy:    He has no cervical adenopathy.  Neurological: He is alert and oriented to person, place, and time. Gait normal.  Skin: Skin is warm and dry. No rash noted. He is not diaphoretic.  Psychiatric: Mood, memory, affect and judgment normal.  Nursing note and vitals reviewed.   Urgent Care Treatments / Results:   LABS: PLEASE NOTE: all labs that were ordered this encounter are listed, however only abnormal results are displayed. Labs Reviewed  RAPID STREP SCREEN ([facility] ONLY)  CULTURE, GROUP A STREP ([facility])  NOVEL CORONAVIRUS, NAA (HOSP ORDER, SEND-OUT TO REF LAB; TAT 18-24 HRS)    EKG: -None  RADIOLOGY: No results found.  PROCEDURES: Procedures  MEDICATIONS RECEIVED THIS VISIT: Medications - No data to display  PERTINENT CLINICAL COURSE NOTES/UPDATES:   Initial Impression / Assessment and Plan / Urgent Care Course:  Pertinent labs & imaging results that were available during my care of the patient were personally reviewed by me and considered in my medical decision making (see lab/imaging section of note for values and interpretations).  Norman ClayDaniel Pinon is a 23 y.o. male who [facility] with complaints of Sore Throat and Headache   Patient overall well appearing and in no acute distress today in clinic. Presenting symptoms (see HPI) and exam as documented above. He presents with symptoms associated with SARS-CoV-2 (novel coronavirus). Discussed typical symptom constellation. Reviewed potential for infection and need for testing. Patient amenable to being tested. SARS-CoV-2 swab collected by certified clinical staff. Discussed variable turn around times associated with testing, as swabs are being processed at  [facility], and have been taking between 2-5 days to come back. He was advised to self quarantine, per [facility] DH[facility] guidelines, until negative results received.   Rapid streptococcal throat swab (-); reflex culture sent. Symptoms consistent with viral UTI. Dicussed supportive care measures at home during acute phase of illness. Patient to rest as much as possible. He was encouraged to ensure adequate hydration (water and ORS) to prevent dehydration and electrolyte derangements. Patient may use APAP and/or IBU on an as needed basis for pain/fever.   Current clinical condition warrants patient being out of work in order to recover from her current injury/illness. He was provided with the appropriate documentation to provide to his place of employment that will allow for him to RTW on 06/13/2019 with no restrictions. RTW is contingent on his SARS-CoV-2 test results being reviewed as negative.    Discussed follow up with primary care physician in 1 week for re-evaluation. I have reviewed the follow up and strict return precautions for any new or worsening symptoms. Patient is aware of symptoms that would be deemed urgent/emergent, and would thus require further evaluation either here or in the emergency department. At the time of discharge, he verbalized understanding and consent with the discharge plan as it was reviewed with him. All questions were fielded by provider and/or clinic staff prior to patient  discharge.    Final Clinical Impressions / Urgent Care Diagnoses:   Final diagnoses:  Advice Given About Covid-19 Virus Infection  Viral URI    New Prescriptions:  [place] Controlled Substance Registry consulted? Not Applicable  No orders of the defined types were placed in this encounter.   Recommended Follow up Care:  Patient encouraged to follow up with the following provider within the specified time frame, or sooner as dictated by the severity of his symptoms. As always, he was instructed that for any  urgent/emergent care needs, he should seek care either here or in the emergency department for more immediate evaluation.  Follow-up Information    PCP In 1 week.   Why: General reassessment of symptoms if not improving        NOTE: This note was prepared using Lobbyist along with smaller Company secretary. Despite my best ability to proofread, there is the potential that transcriptional errors may still occur from this process, and are completely unintentional.    Karen Kitchens, NP 06/11/19 1656

## 2019-06-11 NOTE — Discharge Instructions (Addendum)
It was very nice seeing you today in clinic. Thank you for entrusting me with your care.   Rest and increase fluid intake as much as possible. Try to incorporate electrolyte enriched fluids, such as Gatorade or Pedialyte, into your daily fluid intake.    Tested for SARS-CoV-2 (novel coronavirus) today. Need to self quarantine at home until negative test results have been received (per [place] DHHS guidelines).   Make arrangements to follow up with your regular doctor in 1 week for re-evaluation if not improving. If your symptoms/condition worsens, please seek follow up care either here or in the ER. Please remember, our [facility] providers are "right here with you" when you need [place].   Again, it was my pleasure to take care of you today. Thank you for choosing our clinic. I hope that you start to feel better quickly.   Honor Loh, MSN, APRN, FNP-C, CEN Advanced Practice Provider [facility]

## 2019-06-11 NOTE — ED Triage Notes (Signed)
Patient c/o headache, sore throat, nausea and chills that started this morning. Denies fever. Patient has taken Tylenol for his symptoms.

## 2019-06-14 LAB — CULTURE, GROUP A STREP (THRC)

## 2019-06-18 ENCOUNTER — Telehealth: Payer: Self-pay | Admitting: Emergency Medicine

## 2019-06-18 LAB — NOVEL CORONAVIRUS, NAA (HOSP ORDER, SEND-OUT TO REF LAB; TAT 18-24 HRS): SARS-CoV-2, NAA: DETECTED — AB

## 2019-06-18 NOTE — Telephone Encounter (Signed)
Patient called requesting results for his COVID testing.  Your test for COVID-19 was positive, meaning that you were infected with the novel coronavirus and could give the germ to others.  Please continue isolation at home, for at least 10 days since the start of your fever/cough/breathlessness and until you have had 3 consecutive days without fever (without taking a fever reducer) and with cough/breathlessness improving. Please continue good preventive care measures, including:  frequent hand-washing, avoid touching your face, cover coughs/sneezes, stay out of crowds and keep a 6 foot distance from others.  Recheck or go to the nearest hospital ED tent for re-assessment if fever/cough/breathlessness return. Patient verbalized understanding.

## 2022-01-27 ENCOUNTER — Ambulatory Visit: Payer: Self-pay

## 2022-02-03 ENCOUNTER — Ambulatory Visit: Payer: Self-pay | Admitting: Family Medicine

## 2022-02-03 ENCOUNTER — Encounter: Payer: Self-pay | Admitting: Family Medicine

## 2022-02-03 DIAGNOSIS — Z113 Encounter for screening for infections with a predominantly sexual mode of transmission: Secondary | ICD-10-CM

## 2022-02-03 DIAGNOSIS — Z202 Contact with and (suspected) exposure to infections with a predominantly sexual mode of transmission: Secondary | ICD-10-CM

## 2022-02-03 LAB — HM HIV SCREENING LAB: HM HIV Screening: NEGATIVE

## 2022-02-03 MED ORDER — DOXYCYCLINE HYCLATE 100 MG PO TABS: 100.0000 mg | ORAL_TABLET | Freq: Two times a day (BID) | ORAL | 0 refills | Status: AC

## 2022-02-03 NOTE — Progress Notes (Signed)
Pt here for STI screening and Chlamydia exposure. Treated for exposure per provider order. Pt verbalized understanding.  Lethea Killings RN ?

## 2022-02-03 NOTE — Progress Notes (Signed)
[facility] Department ?STI clinic/screening visit ? ?Subjective:  ?Albert Stark is a 26 y.o. male being seen today for an STI screening visit. The patient reports they do not have symptoms.   ? ?Patient has the following medical conditions:  There are no problems to display for this patient. ? ? ? ?Chief Complaint  ?Patient presents with  ? SEXUALLY TRANSMITTED DISEASE  ?  STI screening. Denies sx but reports his girlfriend tested positive for chlamydia  ? ? ?HPI ? ?Patient reports here for screening, contact to chlamydia  ? ?Does the patient or their partner desires a pregnancy in the next year? No ? ?Screening for MPX risk: ?Does the patient have an unexplained rash? No ?Is the patient MSM? No ?Does the patient endorse multiple sex partners or anonymous sex partners? No ?Did the patient have close or sexual contact with a person diagnosed with MPX? No ?Has the patient traveled outside the [place] where MPX is endemic? No ?Is there a high clinical suspicion for MPX-- evidenced by one of the following No ? -Unlikely to be chickenpox ? -Lymphadenopathy ? -Rash that present in same phase of evolution on any given body part ? ? ?See flowsheet for further details and programmatic requirements.  ? ?Immunization History  ?Administered Date(s) Administered  ? Tdap 11/09/2007  ?  ? ?The following portions of the patient's history were reviewed and updated as appropriate: allergies, current medications, past medical history, past social history, past surgical history and problem list. ? ?Objective:  ?There were no vitals filed for this visit. ? ?Physical Exam ?Constitutional:   ?   Appearance: Normal appearance.  ?HENT:  ?   Head: Normocephalic.  ?   Mouth/Throat:  ?   Mouth: Mucous membranes are moist.  ?   Pharynx: Oropharynx is clear. No oropharyngeal exudate.  ?Pulmonary:  ?   Effort: Pulmonary effort is normal.  ?Genitourinary: ?   Comments: Deferred  ?Musculoskeletal:  ?   Cervical back: Normal range of  motion.  ?Lymphadenopathy:  ?   Cervical: No cervical adenopathy.  ?Skin: ?   General: Skin is warm and dry.  ?   Findings: No bruising, erythema, lesion or rash.  ?Neurological:  ?   Mental Status: He is alert and oriented to person, place, and time.  ?Psychiatric:     ?   Mood and Affect: Mood normal.     ?   Behavior: Behavior normal.  ? ? ? ? ?Assessment and Plan:  ?Albert Stark is a 26 y.o. male presenting to the [facility] Department for STI screening ? ?1. Screening examination for venereal disease ?Patient does not have STI symptoms ?Patient accepted all screenings including  urine CT/GC and bloodwork for HIV/RPR.  ?Patient meets criteria for HepB screening? No. Ordered? No - no longer has behavior  ?Patient meets criteria for HepC screening? Yes. Ordered? No - no longer has behavior  ?Recommended condom use with all sex ?Discussed importance of condom use for STI prevent ? ?Discussed time line for [facility] results and that patient will be called with positive results and encouraged patient to call if he had not heard in 2 weeks ?Recommended returning for continued or worsening symptoms.   ?- Chlamydia/Gonorrhea [facility] ?- HIV [facility] ?- Syphilis Serology, [facility] ? ?2. Exposure to chlamydia ?Treated for exposure  ?- doxycycline (VIBRA-TABS) 100 MG tablet; Take 1 tablet (100 mg total) by mouth 2 (two) times daily for 7 days.  Dispense: 14 tablet; Refill: 0 ? ? ? ? ?Return for as needed. ? ?No future appointments. ? ?Wendi Snipes, FNP ? ?

## 2022-02-15 ENCOUNTER — Telehealth: Payer: Self-pay

## 2022-02-15 NOTE — Telephone Encounter (Signed)
Calling pt regarding positive chlamydia result from 02/03/22 urine specimen.  Inform pt of + result, already received tx at 02/03/22 visit due to contact to chlamydia, RTC for TOC in approx 3 months.  Phone call to pt at [phone].  Mailbox full, unable to leave voicemail.   Also sent MyChart message.

## 2022-02-16 NOTE — Telephone Encounter (Signed)
Phone call to pt at [phone].  Mailbox full, unable to leave voicemail. Tried twice.

## 2022-02-17 NOTE — Telephone Encounter (Signed)
Phone call to pt and pt confirmed password. Pt counseled regarding positive CT and tx already received. RTC in 3 months for TOC. Pt expressed understanding.

## 2023-06-27 ENCOUNTER — Ambulatory Visit
Admission: EM | Admit: 2023-06-27 | Discharge: 2023-06-27 | Disposition: A | Discharge status: Home / Self Care | Payer: BC Managed Care – PPO | Attending: Emergency Medicine | Admitting: Emergency Medicine

## 2023-06-27 DIAGNOSIS — L237 Allergic contact dermatitis due to plants, except food: Secondary | ICD-10-CM

## 2023-06-27 MED ORDER — PREDNISONE 10 MG (21) PO TBPK: ORAL_TABLET | Freq: Every day | ORAL | 0 refills | Status: AC

## 2023-06-27 MED ORDER — CETIRIZINE HCL 10 MG PO TABS: 10.0000 mg | ORAL_TABLET | Freq: Every day | ORAL | 0 refills | Status: AC

## 2023-06-27 NOTE — ED Triage Notes (Signed)
Patient to Urgent Care with complaints of a rash that started Monday. Present to bilateral arms, left leg, stomach.  Concerned about poison ivy. Had been at a [place] prior to rash.

## 2023-06-27 NOTE — ED Provider Notes (Signed)
Renaldo Fiddler    CSN: [id number] Arrival date & time: 06/27/23  0954      History   Chief Complaint Chief Complaint  Patient presents with   Rash    HPI Daichi Moris is a 27 y.o. male.  Patient presents with pruritic rash on his trunk and extremities x 1 day.  The rash started after he came in contact with poison ivy at a pond.  He has been treating it with topical poison ivy lotion.  He took Benadryl last night.  He denies fever or other symptoms.  No pertinent medical history.  The history is provided by the patient and medical records.    History reviewed. No pertinent past medical history.  There are no problems to display for this patient.   History reviewed. No pertinent surgical history.     Home Medications    Prior to Admission medications   Medication Sig Start Date End Date Taking? Authorizing Provider  cetirizine (ZYRTEC ALLERGY) 10 MG tablet Take 1 tablet (10 mg total) by mouth daily for 14 days. 06/27/23 07/11/23 Yes Mickie Bail, NP  predniSONE (STERAPRED UNI-PAK 21 TAB) 10 MG (21) TBPK tablet Take by mouth daily. As directed 06/27/23  Yes Mickie Bail, NP    Family History History reviewed. No pertinent family history.  Social History Social History   Tobacco Use   Smoking status: Never   Smokeless tobacco: Never  Vaping Use   Vaping status: Never Used  Substance Use Topics   Alcohol use: Yes    Alcohol/week: 14.0 standard drinks of alcohol    Types: 14 Cans of beer per week   Drug use: Yes    Types: Marijuana    Comment: last used 2020     Allergies   Patient has no known allergies.   Review of Systems Review of Systems  Constitutional:  Negative for chills and fever.  HENT:  Negative for ear pain and sore throat.   Respiratory:  Negative for cough and shortness of breath.   Skin:  Positive for color change and rash.     Physical Exam Triage Vital Signs ED Triage Vitals  Encounter Vitals Group     BP       Systolic BP Percentile      Diastolic BP Percentile      Pulse      Resp      Temp      Temp src      SpO2      Weight      Height      Head Circumference      Peak Flow      Pain Score      Pain Loc      Pain Education      Exclude from Growth Chart    No data found.  Updated Vital Signs BP 126/87   Pulse 78   Temp 98 F (36.7 C)   Resp 18   SpO2 98%   Visual Acuity Right Eye Distance:   Left Eye Distance:   Bilateral Distance:    Right Eye Near:   Left Eye Near:    Bilateral Near:     Physical Exam Constitutional:      General: He is not in acute distress. HENT:     Mouth/Throat:     Mouth: Mucous membranes are moist.     Pharynx: Oropharynx is clear.  Cardiovascular:     Rate and Rhythm: Normal rate  and regular rhythm.     Heart sounds: Normal heart sounds.  Pulmonary:     Effort: Pulmonary effort is normal. No respiratory distress.     Breath sounds: Normal breath sounds.  Skin:    General: Skin is warm and dry.     Findings: Rash present.     Comments: Widespread erythematous patchy papular vesicular rash on arms with some lesions on trunk and legs.  Neurological:     Mental Status: He is alert.  Psychiatric:        Mood and Affect: Mood normal.        Behavior: Behavior normal.      UC Treatments / Results  Labs (all labs ordered are listed, but only abnormal results are displayed) Labs Reviewed - No data to display  EKG   Radiology No results found.  Procedures Procedures (including critical care time)  Medications Ordered in UC Medications - No data to display  Initial Impression / Assessment and Plan / UC Course  I have reviewed the triage vital signs and the nursing notes.  Pertinent labs & imaging results that were available during my care of the patient were reviewed by me and considered in my medical decision making (see chart for details).    Poison ivy dermatitis.  Treating with Zyrtec and prednisone.  Instructed  patient to continue the topical calamine lotion.  Education provided on poison ivy dermatitis.  Work note provided per patient request.  Instructed patient to follow-up with his PCP if he is not improving.  He agrees to plan of care.  Final Clinical Impressions(s) / UC Diagnoses   Final diagnoses:  Poison ivy dermatitis     Discharge Instructions      Take the prednisone and Zyrtec as directed.  Continue to use Calamine lotion as directed.  Follow-up with your primary care provider if you are not improving.     ED Prescriptions     Medication Sig Dispense Auth. Provider   cetirizine (ZYRTEC ALLERGY) 10 MG tablet Take 1 tablet (10 mg total) by mouth daily for 14 days. 14 tablet Mickie Bail, NP   predniSONE (STERAPRED UNI-PAK 21 TAB) 10 MG (21) TBPK tablet Take by mouth daily. As directed 21 tablet Mickie Bail, NP      PDMP not reviewed this encounter.   Mickie Bail, NP 06/27/23 1039

## 2023-06-27 NOTE — Discharge Instructions (Addendum)
Take the prednisone and Zyrtec as directed.  Continue to use Calamine lotion as directed.  Follow-up with your primary care provider if you are not improving.
